# Patient Record
Sex: Male | Born: 2000
Health system: Southern US, Community
[De-identification: ages and names within clinical notes are randomized; demographics above are authoritative.]

## PROBLEM LIST (undated history)

## (undated) DIAGNOSIS — F909 Attention-deficit hyperactivity disorder, unspecified type: Secondary | ICD-10-CM

## (undated) DIAGNOSIS — R41840 Attention and concentration deficit: Secondary | ICD-10-CM

## (undated) DIAGNOSIS — F812 Mathematics disorder: Secondary | ICD-10-CM

## (undated) HISTORY — DX: Attention and concentration deficit: R41.840

## (undated) HISTORY — DX: Mathematics disorder: F81.2

## (undated) HISTORY — DX: Attention-deficit hyperactivity disorder, unspecified type: F90.9

## (undated) HISTORY — PX: OTHER SURGICAL HISTORY: SHX169

---
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
## 2019-07-18 DIAGNOSIS — Z03818 Encounter for observation for suspected exposure to other biological agents ruled out: Secondary | ICD-10-CM | POA: Diagnosis not present

## 2019-07-18 DIAGNOSIS — Z20822 Contact with and (suspected) exposure to covid-19: Secondary | ICD-10-CM | POA: Diagnosis not present

## 2019-09-17 ENCOUNTER — Emergency Department (INDEPENDENT_AMBULATORY_CARE_PROVIDER_SITE_OTHER): Payer: BC Managed Care – PPO

## 2019-09-17 ENCOUNTER — Emergency Department (INDEPENDENT_AMBULATORY_CARE_PROVIDER_SITE_OTHER)
Admission: EM | Admit: 2019-09-17 | Discharge: 2019-09-17 | Disposition: A | Discharge status: Home / Self Care | Payer: BC Managed Care – PPO | Source: Home / Self Care

## 2019-09-17 ENCOUNTER — Encounter: Payer: Self-pay | Admitting: Emergency Medicine

## 2019-09-17 ENCOUNTER — Other Ambulatory Visit: Payer: Self-pay

## 2019-09-17 DIAGNOSIS — S63622A Sprain of interphalangeal joint of left thumb, initial encounter: Secondary | ICD-10-CM

## 2019-09-17 DIAGNOSIS — W2102XA Struck by soccer ball, initial encounter: Secondary | ICD-10-CM | POA: Diagnosis not present

## 2019-09-17 DIAGNOSIS — S6992XA Unspecified injury of left wrist, hand and finger(s), initial encounter: Secondary | ICD-10-CM | POA: Diagnosis not present

## 2019-09-17 DIAGNOSIS — M7989 Other specified soft tissue disorders: Secondary | ICD-10-CM | POA: Diagnosis not present

## 2019-09-17 DIAGNOSIS — R600 Localized edema: Secondary | ICD-10-CM | POA: Diagnosis not present

## 2019-09-17 MED ORDER — ACETAMINOPHEN 325 MG PO TABS
650.0000 mg | ORAL_TABLET | Freq: Once | ORAL | Status: AC
  Administered 2019-09-17: 650 mg via ORAL

## 2019-09-17 NOTE — ED Provider Notes (Signed)
[facility]    CSN: [id number] Arrival date & time: 09/17/19  1403      History   Chief Complaint Chief Complaint  Patient presents with  . Finger Injury    HPI Brent Leach is a 19 y.o. male.   HPI Brent Leach is a 19 y.o. male presenting to UC with c/o Left thumb pain, bruising, swelling and limited ROM after a friend threw a soccer ball very fast toward the pt while standing close. Pt felt his thumb hyperextend, then jammed into his chest.  Pain is aching and sore, 9/10, limited ROM. He is Right hand dominant. No prior fracture or thumb.    Past Medical History:  Diagnosis Date  . Hyperactivity   . Inattention   . Learning difficulty involving mathematics     Patient Active Problem List   Diagnosis Date Noted  . Hyperbilirubinemia 10/07/2018  . Anxiety disorder 10/06/2018  . Hyperactivity   . Acute Brent Leach virus (EBV) infection 01/13/2018  . Weight loss due to medication 10/21/2017  . Elevated blood pressure reading 06/02/2017  . Learning difficulty involving mathematics 04/28/2017  . Inattention 04/28/2017    Past Surgical History:  Procedure Laterality Date  . jaw tumor (benign)         Home Medications    Prior to Admission medications   Medication Sig Start Date End Date Taking? Authorizing Provider  lisdexamfetamine (VYVANSE) 20 MG capsule Take 1 capsule (20 mg total) by mouth every morning. 10/06/18   Carlis Stable, PA-C    Family History Family History  Problem Relation Age of Onset  . Skin cancer Other        grandfather  . Depression Mother   . Diabetes Other        grandparents  . Anxiety disorder Brother     Social History Social History   Tobacco Use  . Smoking status: Never Smoker  . Smokeless tobacco: Never Used  Vaping Use  . Vaping Use: Never used  Substance Use Topics  . Alcohol use: Yes  . Drug use: No     Allergies   Concerta [methylphenidate hcl]   Review of Systems Review of  Systems  Musculoskeletal: Positive for arthralgias and joint swelling.  Skin: Positive for color change. Negative for wound.     Physical Exam Triage Vital Signs ED Triage Vitals  Enc Vitals Group     BP 09/17/19 1418 (!) 137/95     Pulse Rate 09/17/19 1417 92     Resp --      Temp 09/17/19 1417 98.4 F (36.9 C)     Temp Source 09/17/19 1417 Oral     SpO2 09/17/19 1418 97 %     Weight 09/17/19 1420 168 lb (76.2 kg)     Height 09/17/19 1420 5\' 10"  (1.778 m)     Head Circumference --      Peak Flow --      Pain Score 09/17/19 1419 9     Pain Loc --      Pain Edu? --      Excl. in GC? --    No data found.  Updated Vital Signs BP (!) 137/95 (BP Location: Right Arm)   Pulse 92   Temp 98.4 F (36.9 C) (Oral)   Ht 5\' 10"  (1.778 m)   Wt 168 lb (76.2 kg)   SpO2 97%   BMI 24.11 kg/m   Visual Acuity Right Eye Distance:   Left Eye  Distance:   Bilateral Distance:    Right Eye Near:   Left Eye Near:    Bilateral Near:     Physical Exam Vitals and nursing note reviewed.  Constitutional:      Appearance: Normal appearance. He is well-developed.  HENT:     Head: Normocephalic and atraumatic.  Cardiovascular:     Rate and Rhythm: Normal rate.  Pulmonary:     Effort: Pulmonary effort is normal.  Musculoskeletal:        General: Swelling and tenderness present.     Cervical back: Normal range of motion.     Comments: Left thumb: moderate edema, diffuse tenderness. Limited ROM, unable to flex at PIP joint, pain/swelling. Full ROM all other fingers and wrist. No snuffbox tenderness.   Skin:    General: Skin is warm and dry.     Capillary Refill: Capillary refill takes less than 2 seconds.     Findings: Bruising ( Left thumb) present.  Neurological:     Mental Status: He is alert and oriented to person, place, and time.     Sensory: No sensory deficit.  Psychiatric:        Behavior: Behavior normal.      UC Treatments / Results  Labs (all labs ordered are  listed, but only abnormal results are displayed) Labs Reviewed - No data to display  EKG   Radiology Narrative & Impression  CLINICAL DATA:  Soccer injury of thumb  EXAM: LEFT HAND - COMPLETE 3+ VIEW  COMPARISON:  None.  FINDINGS: No acute fracture or dislocation. Joint spaces and alignment are maintained. No area of erosion or osseous destruction. No unexpected radiopaque foreign body. Soft tissue edema of the thumb.  IMPRESSION: No acute fracture or dislocation.   Electronically Signed   By: Meda Klinefelter MD   On: 09/17/2019 14:57    Procedures Procedures (including critical care time)  Medications Ordered in UC Medications  acetaminophen (TYLENOL) tablet 650 mg (650 mg Oral Given 09/17/19 1514)    Initial Impression / Assessment and Plan / UC Course  I have reviewed the triage vital signs and the nursing notes.  Pertinent labs & imaging results that were available during my care of the patient were reviewed by me and considered in my medical decision making (see chart for details).    Discussed imaging with pt Place pt in thumb spica splint Encouraged f/u with Orthopedist or Sports Medicine AVS given  Final Clinical Impressions(s) / UC Diagnoses   Final diagnoses:  Sprain of interphalangeal joint of left thumb, initial encounter     Discharge Instructions      You may take 500mg  acetaminophen every 4-6 hours or in combination with ibuprofen 400-600mg  every 6-8 hours as needed for pain and inflammation.  You should wear the brace at all times except for bathing until you are evaluated by a Sports Medicine provider or orthopedist next week.   Call to schedule a follow up appointment with Sports Medicine or an Orthopedist next week for further evaluation and treatment of thumb injury.     ED Prescriptions    None     PDMP not reviewed this encounter.   , Lurene Shadow 09/20/19 [phone]

## 2019-09-17 NOTE — ED Triage Notes (Addendum)
LT thumb injured last night jammed while playing soccer. Vaccinated

## 2019-09-17 NOTE — Discharge Instructions (Signed)
  You may take 500mg  acetaminophen every 4-6 hours or in combination with ibuprofen 400-600mg  every 6-8 hours as needed for pain and inflammation.  You should wear the brace at all times except for bathing until you are evaluated by a Sports Medicine provider or orthopedist next week.   Call to schedule a follow up appointment with Sports Medicine or an Orthopedist next week for further evaluation and treatment of thumb injury.

## 2019-09-22 DIAGNOSIS — M79645 Pain in left finger(s): Secondary | ICD-10-CM | POA: Diagnosis not present

## 2019-09-23 ENCOUNTER — Ambulatory Visit (INDEPENDENT_AMBULATORY_CARE_PROVIDER_SITE_OTHER): Payer: BC Managed Care – PPO | Admitting: Family Medicine

## 2019-09-23 ENCOUNTER — Encounter: Payer: Self-pay | Admitting: Family Medicine

## 2019-09-23 DIAGNOSIS — F988 Other specified behavioral and emotional disorders with onset usually occurring in childhood and adolescence: Secondary | ICD-10-CM | POA: Insufficient documentation

## 2019-09-23 DIAGNOSIS — F900 Attention-deficit hyperactivity disorder, predominantly inattentive type: Secondary | ICD-10-CM | POA: Diagnosis not present

## 2019-09-23 DIAGNOSIS — R41840 Attention and concentration deficit: Secondary | ICD-10-CM | POA: Diagnosis not present

## 2019-09-23 MED ORDER — LISDEXAMFETAMINE DIMESYLATE 20 MG PO CAPS: 20.0000 mg | ORAL_CAPSULE | ORAL | 0 refills | Status: DC

## 2019-09-23 NOTE — Progress Notes (Signed)
Brent Leach - 19 y.o. male MRN [id number]  Date of birth: 04-12-2000  Subjective Chief Complaint  Patient presents with  . Establish Care    HPI Brent Leach is a 19 y.o. male with history of ADHD here today for follow up.  ADHD previously treated with Vyvanse.  He had been off for several months due to previous PCP leaving.  He has noticed increased difficulty concentrating at work and at school since being off medication.  He would like to restart this.  He denies side effects from this previously.    ROS:  A comprehensive ROS was completed and negative except as noted per HPI  Allergies  Allergen Reactions  . Concerta [Methylphenidate Hcl] Other (See Comments)    hypersomnia    Past Medical History:  Diagnosis Date  . Hyperactivity   . Inattention   . Learning difficulty involving mathematics     Past Surgical History:  Procedure Laterality Date  . jaw tumor (benign)      Social History   Socioeconomic History  . Marital status: Single    Spouse name: Not on file  . Number of children: Not on file  . Years of education: Not on file  . Highest education level: Not on file  Occupational History  . Not on file  Tobacco Use  . Smoking status: Never Smoker  . Smokeless tobacco: Never Used  Vaping Use  . Vaping Use: Never used  Substance and Sexual Activity  . Alcohol use: Yes  . Drug use: No  . Sexual activity: Never  Other Topics Concern  . Not on file  Social History Narrative  . Not on file   Social Determinants of Health   Financial Resource Strain:   . Difficulty of Paying Living Expenses: Not on file  Food Insecurity:   . Worried About Programme researcher, broadcasting/film/video in the Last Year: Not on file  . Ran Out of Food in the Last Year: Not on file  Transportation Needs:   . Lack of Transportation (Medical): Not on file  . Lack of Transportation (Non-Medical): Not on file  Physical Activity:   . Days of Exercise per Week: Not on file  . Minutes of Exercise per  Session: Not on file  Stress:   . Feeling of Stress : Not on file  Social Connections:   . Frequency of Communication with Friends and Family: Not on file  . Frequency of Social Gatherings with Friends and Family: Not on file  . Attends Religious Services: Not on file  . Active Member of Clubs or Organizations: Not on file  . Attends Banker Meetings: Not on file  . Marital Status: Not on file    Family History  Problem Relation Age of Onset  . Skin cancer Other        grandfather  . Depression Mother   . Diabetes Other        grandparents  . Anxiety disorder Brother     Health Maintenance  Topic Date Due  . Hepatitis C Screening  Never done  . COVID-19 Vaccine (1) Never done  . HIV Screening  Never done  . INFLUENZA VACCINE  09/05/2019  . TETANUS/TDAP  09/26/2021     ----------------------------------------------------------------------------------------------------------------------------------------------------------------------------------------------------------------- Physical Exam BP 125/78 (BP Location: Left Arm, Patient Position: Sitting, Cuff Size: Normal)   Pulse 72   Temp 98.2 F (36.8 C) (Oral)   Ht 5' 10.87" (1.8 m)   Wt 177 lb 6.4 oz (  80.5 kg)   SpO2 99%   BMI 24.84 kg/m   Physical Exam Constitutional:      Appearance: Normal appearance.  Eyes:     General: No scleral icterus. Cardiovascular:     Rate and Rhythm: Normal rate and regular rhythm.  Pulmonary:     Effort: Pulmonary effort is normal.     Breath sounds: Normal breath sounds.  Musculoskeletal:     Cervical back: Neck supple.  Neurological:     Mental Status: He is alert.  Psychiatric:        Mood and Affect: Mood normal.        Behavior: Behavior normal.      ------------------------------------------------------------------------------------------------------------------------------------------------------------------------------------------------------------------- Assessment and Plan  ADD (attention deficit disorder) Restart vyvanse at previous dosing.  Discussed need to follow up and consistent use of medication.  Return in 3 months.    Meds ordered this encounter  Medications  . lisdexamfetamine (VYVANSE) 20 MG capsule    Sig: Take 1 capsule (20 mg total) by mouth every morning.    Dispense:  30 capsule    Refill:  0    Call patient when ready    Return in about 3 months (around 12/24/2019) for ADD.    This visit occurred during the SARS-CoV-2 public health emergency.  Safety protocols were in place, including screening questions prior to the visit, additional usage of staff PPE, and extensive cleaning of exam room while observing appropriate contact time as indicated for disinfecting solutions.

## 2019-09-23 NOTE — Assessment & Plan Note (Signed)
Restart vyvanse at previous dosing.  Discussed need to follow up and consistent use of medication.  Return in 3 months.

## 2019-09-23 NOTE — Progress Notes (Signed)
Patient states had J&J vaccine via [employer].  Will callback with vaccine info.

## 2019-10-29 ENCOUNTER — Emergency Department
Admission: EM | Admit: 2019-10-29 | Discharge: 2019-10-29 | Disposition: A | Discharge status: Home / Self Care | Payer: BC Managed Care – PPO | Source: Home / Self Care

## 2019-10-29 ENCOUNTER — Encounter: Payer: Self-pay | Admitting: Emergency Medicine

## 2019-10-29 ENCOUNTER — Other Ambulatory Visit: Payer: Self-pay

## 2019-10-29 DIAGNOSIS — J069 Acute upper respiratory infection, unspecified: Secondary | ICD-10-CM | POA: Diagnosis not present

## 2019-10-29 DIAGNOSIS — J029 Acute pharyngitis, unspecified: Secondary | ICD-10-CM | POA: Diagnosis not present

## 2019-10-29 DIAGNOSIS — H6123 Impacted cerumen, bilateral: Secondary | ICD-10-CM

## 2019-10-29 NOTE — Discharge Instructions (Signed)
  You may take 500mg  acetaminophen every 4-6 hours or in combination with ibuprofen 400-600mg  every 6-8 hours as needed for pain, inflammation, and fever.  Be sure to well hydrated with clear liquids and get at least 8 hours of sleep at night, preferably more while sick.   Please follow up with family medicine in 1 week if needed.   You should stay home until the COVID test results come back. Results have been taking 2-4 days. You will be notified ONLY for Positive results. All results will be visible on your free [facility] MyChart account.

## 2019-10-29 NOTE — ED Provider Notes (Signed)
[name]    CSN: [id number] Arrival date & time: 10/29/19  1250      History   Chief Complaint Chief Complaint  Patient presents with  . Sore Throat    HPI Brent Leach is a 19 y.o. male.   HPI  Brent Leach is a 19 y.o. male presenting to UC with c/o sore throat, nasal congestion, cough, fever, chills, and body aches for 3 days. Associated Right ear fullness, intermittent. He received J&J COVID vaccine in April 2021.  No known sick contacts but pt does work at a [employer] and is around a lot of people everyday. He does wear a mask.  Denies chest pain or SOB. No n/v/d.    Past Medical History:  Diagnosis Date  . Hyperactivity   . Inattention   . Learning difficulty involving mathematics     Patient Active Problem List   Diagnosis Date Noted  . ADD (attention deficit disorder) 09/23/2019  . Hyperbilirubinemia 10/07/2018  . Anxiety disorder 10/06/2018  . Hyperactivity   . Acute Malachi Carl virus (EBV) infection 01/13/2018  . Weight loss due to medication 10/21/2017  . Elevated blood pressure reading 06/02/2017  . Learning difficulty involving mathematics 04/28/2017  . Inattention 04/28/2017    Past Surgical History:  Procedure Laterality Date  . jaw tumor (benign)         Home Medications    Prior to Admission medications   Medication Sig Start Date End Date Taking? Authorizing Provider  Pseudoeph-Doxylamine-DM-APAP (DAYQUIL/NYQUIL COLD/FLU RELIEF PO) Take by mouth.   Yes [provider]  lisdexamfetamine (VYVANSE) 20 MG capsule Take 1 capsule (20 mg total) by mouth every morning. 09/23/19   Everrett Coombe, DO    Family History Family History  Problem Relation Age of Onset  . Skin cancer Other        grandfather  . Depression Mother   . Diabetes Other        grandparents  . Anxiety disorder Brother     Social History Social History   Tobacco Use  . Smoking status: Never Smoker  . Smokeless tobacco: Never Used   Vaping Use  . Vaping Use: Never used  Substance Use Topics  . Alcohol use: Yes  . Drug use: No     Allergies   Concerta [methylphenidate hcl]   Review of Systems Review of Systems  Constitutional: Positive for chills and fever.  HENT: Positive for congestion and sore throat. Negative for ear pain, trouble swallowing and voice change.   Respiratory: Positive for cough. Negative for shortness of breath.   Cardiovascular: Negative for chest pain and palpitations.  Gastrointestinal: Negative for abdominal pain, diarrhea, nausea and vomiting.  Musculoskeletal: Positive for arthralgias, back pain and myalgias.  Skin: Negative for rash.  Neurological: Positive for headaches. Negative for dizziness and light-headedness.  All other systems reviewed and are negative.    Physical Exam Triage Vital Signs ED Triage Vitals  Enc Vitals Group     BP 10/29/19 1303 (!) 147/84     Pulse Rate 10/29/19 1303 76     Resp --      Temp 10/29/19 1303 98.1 F (36.7 C)     Temp Source 10/29/19 1303 Oral     SpO2 10/29/19 1303 98 %     Weight 10/29/19 1304 175 lb (79.4 kg)     Height 10/29/19 1304 5\' 11"  (1.803 m)     Head Circumference --      Peak  Flow --      Pain Score 10/29/19 1304 8     Pain Loc --      Pain Edu? --      Excl. in GC? --    No data found.  Updated Vital Signs BP (!) 147/84 (BP Location: Right Arm)   Pulse 76   Temp 98.1 F (36.7 C) (Oral)   Ht 5\' 11"  (1.803 m)   Wt 175 lb (79.4 kg)   SpO2 98%   BMI 24.41 kg/m   Visual Acuity Right Eye Distance:   Left Eye Distance:   Bilateral Distance:    Right Eye Near:   Left Eye Near:    Bilateral Near:     Physical Exam Vitals and nursing note reviewed.  Constitutional:      General: He is not in acute distress.    Appearance: He is well-developed. He is not ill-appearing, toxic-appearing or diaphoretic.  HENT:     Head: Normocephalic and atraumatic.     Right Ear: Tympanic membrane and ear canal normal. There  is impacted cerumen. Tympanic membrane is not erythematous or bulging.     Left Ear: Tympanic membrane and ear canal normal. There is impacted cerumen. Tympanic membrane is not erythematous or bulging.     Nose: Nose normal.     Right Sinus: No maxillary sinus tenderness or frontal sinus tenderness.     Left Sinus: No maxillary sinus tenderness or frontal sinus tenderness.     Mouth/Throat:     Lips: Pink.     Mouth: Mucous membranes are moist.     Pharynx: Oropharynx is clear. Uvula midline. No pharyngeal swelling, oropharyngeal exudate, posterior oropharyngeal erythema or uvula swelling.     Tonsils: No tonsillar exudate or tonsillar abscesses.  Cardiovascular:     Rate and Rhythm: Normal rate and regular rhythm.  Pulmonary:     Effort: Pulmonary effort is normal. No respiratory distress.     Breath sounds: Normal breath sounds. No stridor. No wheezing, rhonchi or rales.  Musculoskeletal:        General: Normal range of motion.     Cervical back: Normal range of motion and neck supple.  Lymphadenopathy:     Cervical: No cervical adenopathy.  Skin:    General: Skin is warm and dry.  Neurological:     Mental Status: He is alert and oriented to person, place, and time.  Psychiatric:        Behavior: Behavior normal.      UC Treatments / Results  Labs (all labs ordered are listed, but only abnormal results are displayed) Labs Reviewed  NOVEL CORONAVIRUS, NAA    EKG   Radiology No results found.  Procedures Procedures (including critical care time)  Medications Ordered in UC Medications - No data to display  Initial Impression / Assessment and Plan / UC Course  I have reviewed the triage vital signs and the nursing notes.  Pertinent labs & imaging results that were available during my care of the patient were reviewed by me and considered in my medical decision making (see chart for details).    Hx and exam not c/w strep pharyngitis Cerumen impaction noted on  exam, successfully flushed, no evidence of AOM or otitis externa. Exam c/w viral URI COVID test pending F/u with PCP as needed AVS given  Final Clinical Impressions(s) / UC Diagnoses   Final diagnoses:  Viral URI with cough  Acute pharyngitis, unspecified etiology  Bilateral impacted cerumen  Discharge Instructions      You may take 500mg  acetaminophen every 4-6 hours or in combination with ibuprofen 400-600mg  every 6-8 hours as needed for pain, inflammation, and fever.  Be sure to well hydrated with clear liquids and get at least 8 hours of sleep at night, preferably more while sick.   Please follow up with family medicine in 1 week if needed.   You should stay home until the COVID test results come back. Results have been taking 2-4 days. You will be notified ONLY for Positive results. All results will be visible on your free [facility] MyChart account.     ED Prescriptions    None     PDMP not reviewed this encounter.   , Lurene Shadow 10/29/19 [phone]

## 2019-10-29 NOTE — ED Triage Notes (Signed)
Headache, sore throat, chills, bodyaches, fever, nausea, cough x 3 days Vaccinated

## 2019-10-31 LAB — NOVEL CORONAVIRUS, NAA: SARS-CoV-2, NAA: NOT DETECTED

## 2019-10-31 LAB — SARS-COV-2, NAA 2 DAY TAT

## 2019-11-11 ENCOUNTER — Telehealth (INDEPENDENT_AMBULATORY_CARE_PROVIDER_SITE_OTHER): Payer: BC Managed Care – PPO | Admitting: Family Medicine

## 2019-11-11 ENCOUNTER — Encounter: Payer: Self-pay | Admitting: Family Medicine

## 2019-11-11 DIAGNOSIS — J069 Acute upper respiratory infection, unspecified: Secondary | ICD-10-CM | POA: Diagnosis not present

## 2019-11-11 NOTE — Progress Notes (Signed)
Brent Leach - 19 y.o. male MRN [id number]  Date of birth: 2000/03/22   This visit type was conducted due to national recommendations for restrictions regarding the COVID-19 Pandemic (e.g. social distancing).  This format is felt to be most appropriate for this patient at this time.  All issues noted in this document were discussed and addressed.  No physical exam was performed (except for noted visual exam findings with Video Visits).  I discussed the limitations of evaluation and management by telemedicine and the availability of in person appointments. The patient expressed understanding and agreed to proceed.  I connected with@ on 11/11/19 at 11:30 AM EDT by a video enabled telemedicine application and verified that I am speaking with the correct person using two identifiers.  Present at visit: Brent Coombe, DO [facility] Feazell   Patient Location: Home [address] [place] [address]   Provider location:   [facility]  Chief Complaint  Patient presents with  . Cough  . Nasal Congestion    HPI  Brent Leach is a 19 y.o. male who presents via audio/video conferencing for a telehealth visit today.  He has complaint of cough and congestion.  Initial onset of symptoms was about 1.5-2 weeks ago.  Started with headache, sore throat, congestion, cough, fever, chills and body aches.  He was seen at Urgent care on 9/24 and had COVID test which returned negative.  Symptoms had nearly resolved and then a couple of days ago started having some mild cough and congestion.  He has not had any further fevers, chills, body aches or headaches.  His appetite and fluid intake have been good.    ROS:  A comprehensive ROS was completed and negative except as noted per HPI    ROS:  A comprehensive ROS was completed and negative except as noted per HPI  Past Medical History:  Diagnosis Date  . Hyperactivity   . Inattention   . Learning difficulty involving mathematics     Past Surgical History:   Procedure Laterality Date  . jaw tumor (benign)      Family History  Problem Relation Age of Onset  . Skin cancer Other        grandfather  . Depression Mother   . Diabetes Other        grandparents  . Anxiety disorder Brother     Social History   Socioeconomic History  . Marital status: Single    Spouse name: Not on file  . Number of children: Not on file  . Years of education: Not on file  . Highest education level: Not on file  Occupational History  . Not on file  Tobacco Use  . Smoking status: Never Smoker  . Smokeless tobacco: Never Used  Vaping Use  . Vaping Use: Never used  Substance and Sexual Activity  . Alcohol use: Yes  . Drug use: No  . Sexual activity: Never  Other Topics Concern  . Not on file  Social History Narrative  . Not on file   Social Determinants of Health   Financial Resource Strain:   . Difficulty of Paying Living Expenses: Not on file  Food Insecurity:   . Worried About Programme researcher, broadcasting/film/video in the Last Year: Not on file  . Ran Out of Food in the Last Year: Not on file  Transportation Needs:   . Lack of Transportation (Medical): Not on file  . Lack of Transportation (Non-Medical): Not on file  Physical Activity:   .  Days of Exercise per Week: Not on file  . Minutes of Exercise per Session: Not on file  Stress:   . Feeling of Stress : Not on file  Social Connections:   . Frequency of Communication with Friends and Family: Not on file  . Frequency of Social Gatherings with Friends and Family: Not on file  . Attends Religious Services: Not on file  . Active Member of Clubs or Organizations: Not on file  . Attends Banker Meetings: Not on file  . Marital Status: Not on file  Intimate Partner Violence:   . Fear of Current or Ex-Partner: Not on file  . Emotionally Abused: Not on file  . Physically Abused: Not on file  . Sexually Abused: Not on file     Current Outpatient Medications:  .  lisdexamfetamine (VYVANSE)  20 MG capsule, Take 1 capsule (20 mg total) by mouth every morning., Disp: 30 capsule, Rfl: 0 .  Pseudoeph-Doxylamine-DM-APAP (DAYQUIL/NYQUIL COLD/FLU RELIEF PO), Take by mouth., Disp: , Rfl:   EXAM:  VITALS per patient if applicable: Temp 98 F (36.7 C)   Wt 175 lb (79.4 kg)   BMI 24.41 kg/m   GENERAL: alert, oriented, appears well and in no acute distress  HEENT: atraumatic, conjunttiva clear, no obvious abnormalities on inspection of external nose and ears  NECK: normal movements of the head and neck  LUNGS: on inspection no signs of respiratory distress, breathing rate appears normal, no obvious gross SOB, gasping or wheezing  CV: no obvious cyanosis  MS: moves all visible extremities without noticeable abnormality  PSYCH/NEURO: pleasant and cooperative, no obvious depression or anxiety, speech and thought processing grossly intact  ASSESSMENT AND PLAN:  Discussed the following assessment and plan:  Viral URI Symptoms seem to be improving.  Still with some lingering cough and congestion.  >10 days since symptom onset and had negative covid test.  I recommended continued supportive care at home.  He may continue OTC symptomatic treatment as needed per packaging directions.  Instructed to call for new or worsening symptoms.      I discussed the assessment and treatment plan with the patient. The patient was provided an opportunity to ask questions and all were answered. The patient agreed with the plan and demonstrated an understanding of the instructions.   The patient was advised to call back or seek an in-person evaluation if the symptoms worsen or if the condition fails to improve as anticipated.    Brent Coombe, DO

## 2019-11-11 NOTE — Assessment & Plan Note (Signed)
Symptoms seem to be improving.  Still with some lingering cough and congestion.  >10 days since symptom onset and had negative covid test.  I recommended continued supportive care at home.  He may continue OTC symptomatic treatment as needed per packaging directions.  Instructed to call for new or worsening symptoms.

## 2019-11-11 NOTE — Progress Notes (Signed)
Symptom x 1 week. Stopped on Sat.  Returned on Sunday.  Headache Sore throat  Cough Fever Chills Body aches  Current symptoms: cough, congestion  COVID: [facility] 05/06/2019

## 2019-12-24 ENCOUNTER — Ambulatory Visit: Payer: BC Managed Care – PPO | Admitting: Family Medicine

## 2020-03-22 ENCOUNTER — Ambulatory Visit (INDEPENDENT_AMBULATORY_CARE_PROVIDER_SITE_OTHER): Payer: BC Managed Care – PPO | Admitting: Family Medicine

## 2020-03-22 ENCOUNTER — Other Ambulatory Visit: Payer: Self-pay

## 2020-03-22 ENCOUNTER — Encounter: Payer: Self-pay | Admitting: Family Medicine

## 2020-03-22 DIAGNOSIS — F900 Attention-deficit hyperactivity disorder, predominantly inattentive type: Secondary | ICD-10-CM | POA: Diagnosis not present

## 2020-03-22 DIAGNOSIS — R41840 Attention and concentration deficit: Secondary | ICD-10-CM | POA: Diagnosis not present

## 2020-03-22 MED ORDER — LISDEXAMFETAMINE DIMESYLATE 20 MG PO CAPS: 20.0000 mg | ORAL_CAPSULE | ORAL | 0 refills | Status: DC

## 2020-03-22 NOTE — Assessment & Plan Note (Signed)
He continues to do well with current dose of vyvanse.   Rx renewed.  PDMP reviewed.  Return in 3 months.

## 2020-03-22 NOTE — Patient Instructions (Signed)
I have renewed vyvanse Follow up with me in 3 months.

## 2020-03-22 NOTE — Progress Notes (Signed)
Brent Leach - 20 y.o. male MRN [id number]  Date of birth: 11-15-2000  Subjective Chief Complaint  Patient presents with  . Medication Refill    HPI Brent Leach is a 20 y.o. male here today for follow up of ADHD.  He has been treated with vyvanse 20mg  daily.  Currently in business school at and works at [employer]. Reports that medication continues to work well for him.  He denies side effects including palpitations, headache, anxiety, or insomnia.   ROS:  A comprehensive ROS was completed and negative except as noted per HPI  Allergies  Allergen Reactions  . Concerta [Methylphenidate Hcl] Other (See Comments)    hypersomnia    Past Medical History:  Diagnosis Date  . Hyperactivity   . Inattention   . Learning difficulty involving mathematics     Past Surgical History:  Procedure Laterality Date  . jaw tumor (benign)      Social History   Socioeconomic History  . Marital status: Single    Spouse name: Not on file  . Number of children: Not on file  . Years of education: Not on file  . Highest education level: Not on file  Occupational History  . Not on file  Tobacco Use  . Smoking status: Never Smoker  . Smokeless tobacco: Never Used  Vaping Use  . Vaping Use: Never used  Substance and Sexual Activity  . Alcohol use: Yes  . Drug use: No  . Sexual activity: Never  Other Topics Concern  . Not on file  Social History Narrative  . Not on file   Social Determinants of Health   Financial Resource Strain: Not on file  Food Insecurity: Not on file  Transportation Needs: Not on file  Physical Activity: Not on file  Stress: Not on file  Social Connections: Not on file    Family History  Problem Relation Age of Onset  . Skin cancer Other        grandfather  . Depression Mother   . Diabetes Other        grandparents  . Anxiety disorder Brother     Health Maintenance  Topic Date Due  . Hepatitis C Screening  Never done  . HIV Screening  Never  done  . COVID-19 Vaccine (2 - Booster for [employer] series) 07/01/2019  . INFLUENZA VACCINE  Never done  . TETANUS/TDAP  09/26/2021     ----------------------------------------------------------------------------------------------------------------------------------------------------------------------------------------------------------------- Physical Exam BP 135/72 (BP Location: Right Arm, Patient Position: Sitting, Cuff Size: Large)   Pulse 96   Temp 98.3 F (36.8 C)   Wt 197 lb (89.4 kg)   BMI 27.48 kg/m   Physical Exam HENT:     Head: Normocephalic and atraumatic.  Eyes:     General: No scleral icterus. Cardiovascular:     Rate and Rhythm: Normal rate and regular rhythm.  Pulmonary:     Effort: Pulmonary effort is normal.     Breath sounds: Normal breath sounds.  Musculoskeletal:     Cervical back: Neck supple.  Neurological:     General: No focal deficit present.  Psychiatric:        Mood and Affect: Mood normal.        Behavior: Behavior normal.     ------------------------------------------------------------------------------------------------------------------------------------------------------------------------------------------------------------------- Assessment and Plan  ADD (attention deficit disorder) He continues to do well with current dose of vyvanse.   Rx renewed.  PDMP reviewed.  Return in 3 months.    Meds ordered this encounter  Medications  . lisdexamfetamine (VYVANSE) 20 MG capsule    Sig: Take 1 capsule (20 mg total) by mouth every morning.    Dispense:  30 capsule    Refill:  0    Call patient when ready    Return in about 3 months (around 06/19/2020) for ADHD.    This visit occurred during the SARS-CoV-2 public health emergency.  Safety protocols were in place, including screening questions prior to the visit, additional usage of staff PPE, and extensive cleaning of exam room while observing appropriate contact time as indicated for  disinfecting solutions.

## 2020-06-19 ENCOUNTER — Other Ambulatory Visit: Payer: Self-pay

## 2020-06-19 ENCOUNTER — Ambulatory Visit (INDEPENDENT_AMBULATORY_CARE_PROVIDER_SITE_OTHER): Payer: BC Managed Care – PPO | Admitting: Family Medicine

## 2020-06-19 ENCOUNTER — Encounter: Payer: Self-pay | Admitting: Family Medicine

## 2020-06-19 ENCOUNTER — Ambulatory Visit: Payer: BC Managed Care – PPO | Admitting: Family Medicine

## 2020-06-19 DIAGNOSIS — F900 Attention-deficit hyperactivity disorder, predominantly inattentive type: Secondary | ICD-10-CM | POA: Diagnosis not present

## 2020-06-19 DIAGNOSIS — R41840 Attention and concentration deficit: Secondary | ICD-10-CM | POA: Diagnosis not present

## 2020-06-19 MED ORDER — LISDEXAMFETAMINE DIMESYLATE 20 MG PO CAPS: 20.0000 mg | ORAL_CAPSULE | ORAL | 0 refills | Status: DC

## 2020-06-19 NOTE — Progress Notes (Signed)
Brent Leach - 20 y.o. male MRN [id number]  Date of birth: [birth year]  Subjective Chief Complaint  Patient presents with  . ADHD    HPI Brent Leach is a 20 y.o. male here today for follow up of adhd.  Currently managed with vyvanse 20mg  daily.  He has done quite well with current medication and denies side effects including increased anxiety, insomnia, palpitations or significant appetite suppression. He is currently out for summer break form college but is working regularly.    ROS:  A comprehensive ROS was completed and negative except as noted per HPI   Allergies  Allergen Reactions  . Concerta [Methylphenidate Hcl] Other (See Comments)    hypersomnia    Past Medical History:  Diagnosis Date  . Hyperactivity   . Inattention   . Learning difficulty involving mathematics     Past Surgical History:  Procedure Laterality Date  . jaw tumor (benign)      Social History   Socioeconomic History  . Marital status: Single    Spouse name: Not on file  . Number of children: Not on file  . Years of education: Not on file  . Highest education level: Not on file  Occupational History  . Not on file  Tobacco Use  . Smoking status: Never Smoker  . Smokeless tobacco: Never Used  Vaping Use  . Vaping Use: Never used  Substance and Sexual Activity  . Alcohol use: Yes  . Drug use: No  . Sexual activity: Never  Other Topics Concern  . Not on file  Social History Narrative  . Not on file   Social Determinants of Health   Financial Resource Strain: Not on file  Food Insecurity: Not on file  Transportation Needs: Not on file  Physical Activity: Not on file  Stress: Not on file  Social Connections: Not on file    Family History  Problem Relation Age of Onset  . Skin cancer Other        grandfather  . Depression Mother   . Diabetes Other        grandparents  . Anxiety disorder Brother     Health Maintenance  Topic Date Due  . HPV VACCINES (1 - Male 2-dose series)  Never done  . HIV Screening  Never done  . Hepatitis C Screening  Never done  . COVID-19 Vaccine (2 - Booster for series) 07/01/2019  . INFLUENZA VACCINE  09/04/2020  . TETANUS/TDAP  09/26/2021     ----------------------------------------------------------------------------------------------------------------------------------------------------------------------------------------------------------------- Physical Exam BP (!) 158/91 (BP Location: Left Arm, Patient Position: Sitting, Cuff Size: Normal)   Pulse 84   Temp 98.3 F (36.8 C)   Ht 5\' 11"  (1.803 m)   Wt 193 lb 9.6 oz (87.8 kg)   SpO2 100%   BMI 27.00 kg/m   Physical Exam Constitutional:      Appearance: Normal appearance.  Eyes:     General: No scleral icterus. Cardiovascular:     Rate and Rhythm: Normal rate and regular rhythm.  Pulmonary:     Effort: Pulmonary effort is normal.     Breath sounds: Normal breath sounds.  Musculoskeletal:     Cervical back: Neck supple.  Neurological:     General: No focal deficit present.     Mental Status: He is alert.  Psychiatric:        Mood and Affect: Mood normal.        Behavior: Behavior normal.     ------------------------------------------------------------------------------------------------------------------------------------------------------------------------------------------------------------------- Assessment and Plan  ADD (attention deficit disorder) He is doing well with Vyvanse 20mg .  We'll plan to continue and have him follow up in 3-4 months.  Medication renewed today.    No orders of the defined types were placed in this encounter.   No follow-ups on file.    This visit occurred during the SARS-CoV-2 public health emergency.  Safety protocols were in place, including screening questions prior to the visit, additional usage of staff PPE, and extensive cleaning of exam room while observing appropriate contact time as indicated for  disinfecting solutions.

## 2020-06-19 NOTE — Assessment & Plan Note (Signed)
He is doing well with Vyvanse 20mg .  We'll plan to continue and have him follow up in 3-4 months.  Medication renewed today.

## 2020-07-05 DIAGNOSIS — S61212A Laceration without foreign body of right middle finger without damage to nail, initial encounter: Secondary | ICD-10-CM | POA: Diagnosis not present

## 2020-09-26 ENCOUNTER — Other Ambulatory Visit: Payer: Self-pay

## 2020-09-26 ENCOUNTER — Encounter: Payer: Self-pay | Admitting: Family Medicine

## 2020-09-26 ENCOUNTER — Ambulatory Visit (INDEPENDENT_AMBULATORY_CARE_PROVIDER_SITE_OTHER): Payer: BC Managed Care – PPO | Admitting: Family Medicine

## 2020-09-26 DIAGNOSIS — R41840 Attention and concentration deficit: Secondary | ICD-10-CM

## 2020-09-26 DIAGNOSIS — F900 Attention-deficit hyperactivity disorder, predominantly inattentive type: Secondary | ICD-10-CM | POA: Diagnosis not present

## 2020-09-26 MED ORDER — LISDEXAMFETAMINE DIMESYLATE 20 MG PO CAPS: 20.0000 mg | ORAL_CAPSULE | ORAL | 0 refills | Status: DC

## 2020-09-26 NOTE — Progress Notes (Signed)
Brent Leach - 20 y.o. male MRN [id number]  Date of birth: 2000-09-22  Subjective Chief Complaint  Patient presents with   ADD    HPI Brent Leach is a 20 year old male here today for follow-up visit.  Continues to do well with Vyvanse 20 mg daily.  He did recent restart college and is a sophomore at [employer].  He has not had any notable side effects from medication including increased anxiety or insomnia.  ROS:  A comprehensive ROS was completed and negative except as noted per HPI  Allergies  Allergen Reactions   Concerta [Methylphenidate Hcl] Other (See Comments)    hypersomnia    Past Medical History:  Diagnosis Date   Hyperactivity    Inattention    Learning difficulty involving mathematics     Past Surgical History:  Procedure Laterality Date   jaw tumor (benign)      Social History   Socioeconomic History   Marital status: Single    Spouse name: Not on file   Number of children: Not on file   Years of education: Not on file   Highest education level: Not on file  Occupational History   Not on file  Tobacco Use   Smoking status: Never   Smokeless tobacco: Never  Vaping Use   Vaping Use: Never used  Substance and Sexual Activity   Alcohol use: Yes   Drug use: No   Sexual activity: Never  Other Topics Concern   Not on file  Social History Narrative   Not on file   Social Determinants of Health   Financial Resource Strain: Not on file  Food Insecurity: Not on file  Transportation Needs: Not on file  Physical Activity: Not on file  Stress: Not on file  Social Connections: Not on file    Family History  Problem Relation Age of Onset   Skin cancer Other        grandfather   Depression Mother    Diabetes Other        grandparents   Anxiety disorder Brother     Health Maintenance  Topic Date Due   Pneumococcal Vaccine 36-64 Years old (1 - PCV) Never done   HPV VACCINES (1 - Male 2-dose series) Never done   HIV Screening  Never done   Hepatitis C  Screening  Never done   COVID-19 Vaccine (2 - [employer] risk series) 06/03/2019   INFLUENZA VACCINE  09/04/2020   TETANUS/TDAP  09/26/2021     ----------------------------------------------------------------------------------------------------------------------------------------------------------------------------------------------------------------- Physical Exam BP 123/81 (BP Location: Left Arm, Patient Position: Sitting, Cuff Size: Large)   Pulse 96   Temp 97.8 F (36.6 C)   Ht 5\' 11"  (1.803 m)   Wt 205 lb (93 kg)   SpO2 99%   BMI 28.59 kg/m   Physical Exam Constitutional:      Appearance: Normal appearance.  HENT:     Head: Normocephalic and atraumatic.  Eyes:     General: No scleral icterus. Cardiovascular:     Rate and Rhythm: Normal rate and regular rhythm.  Musculoskeletal:     Cervical back: Neck supple.  Neurological:     General: No focal deficit present.     Mental Status: He is alert.  Psychiatric:        Mood and Affect: Mood normal.        Behavior: Behavior normal.    ------------------------------------------------------------------------------------------------------------------------------------------------------------------------------------------------------------------- Assessment and Plan  ADD (attention deficit disorder) Doing well with current dose of Vyvanse.  He will let me  know if he notices any increasing symptoms during this semester of college.  We will plan to follow-up in 3 to 4 months.  Updated prescription sent to pharmacy.   Meds ordered this encounter  Medications   lisdexamfetamine (VYVANSE) 20 MG capsule    Sig: Take 1 capsule (20 mg total) by mouth every morning.    Dispense:  30 capsule    Refill:  0    Call patient when ready    Return in about 4 months (around 01/26/2021) for ADD.    This visit occurred during the SARS-CoV-2 public health emergency.  Safety protocols were in place, including screening questions prior  to the visit, additional usage of staff PPE, and extensive cleaning of exam room while observing appropriate contact time as indicated for disinfecting solutions.

## 2020-09-26 NOTE — Assessment & Plan Note (Signed)
Doing well with current dose of Vyvanse.  He will let me know if he notices any increasing symptoms during this semester of college.  We will plan to follow-up in 3 to 4 months.  Updated prescription sent to pharmacy.

## 2021-01-26 ENCOUNTER — Ambulatory Visit: Payer: BC Managed Care – PPO | Admitting: Family Medicine

## 2021-10-27 IMAGING — DX DG HAND COMPLETE 3+V*L*
3 series · 3 of 3 positions shown · non-contrast
Comparison: None.

CLINICAL DATA: Soccer injury of thumb

EXAM:
LEFT HAND - COMPLETE 3+ VIEW

[hand pa]
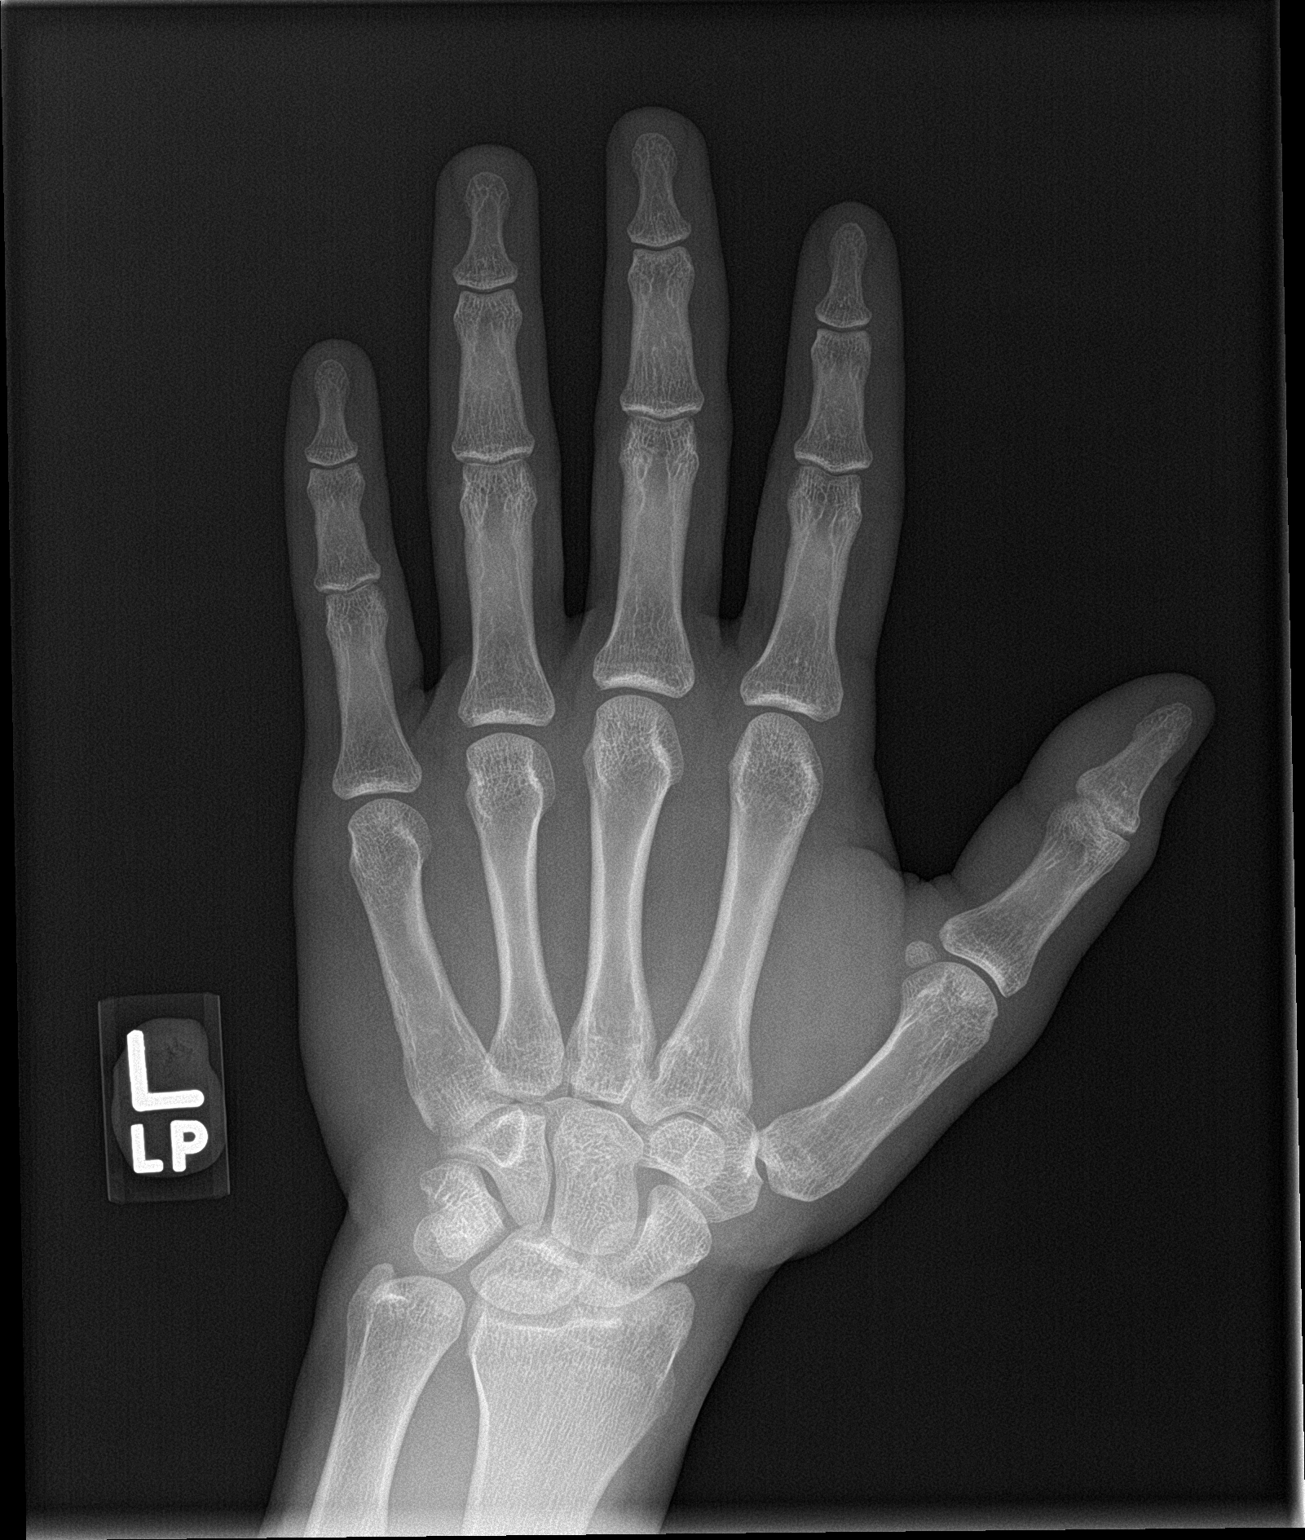

[hand obl]
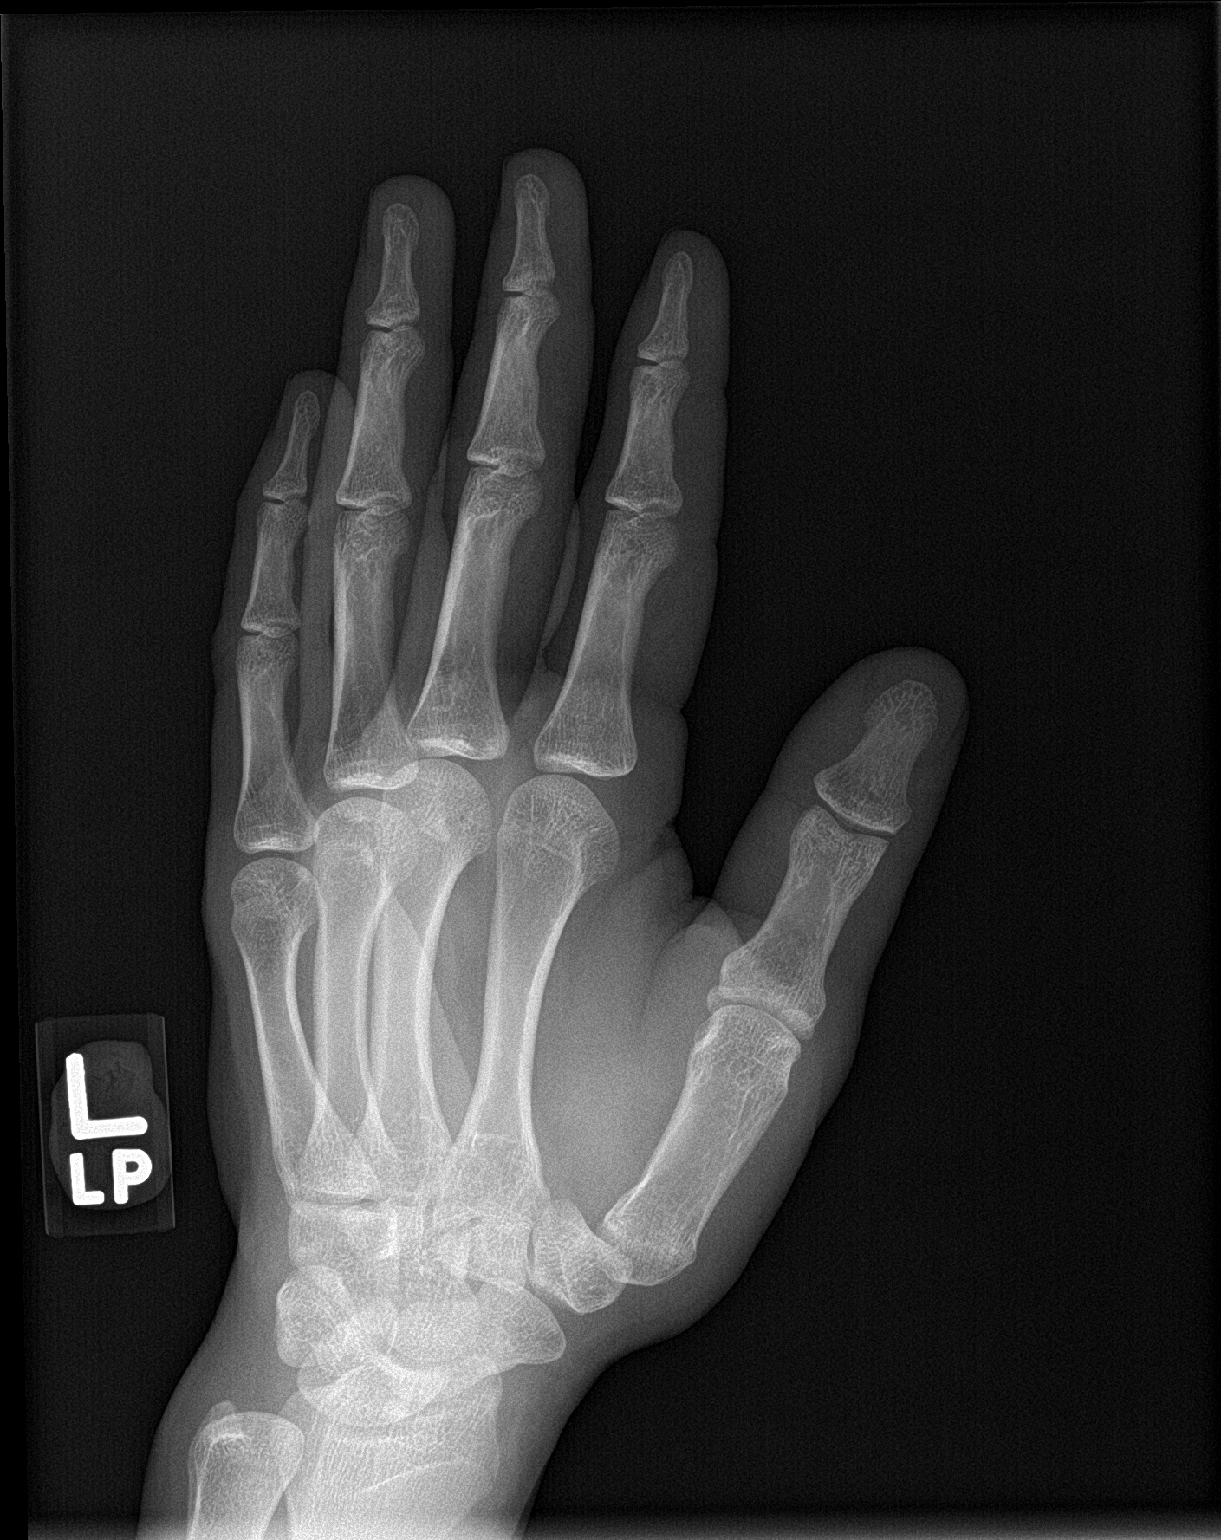

[hand lat]
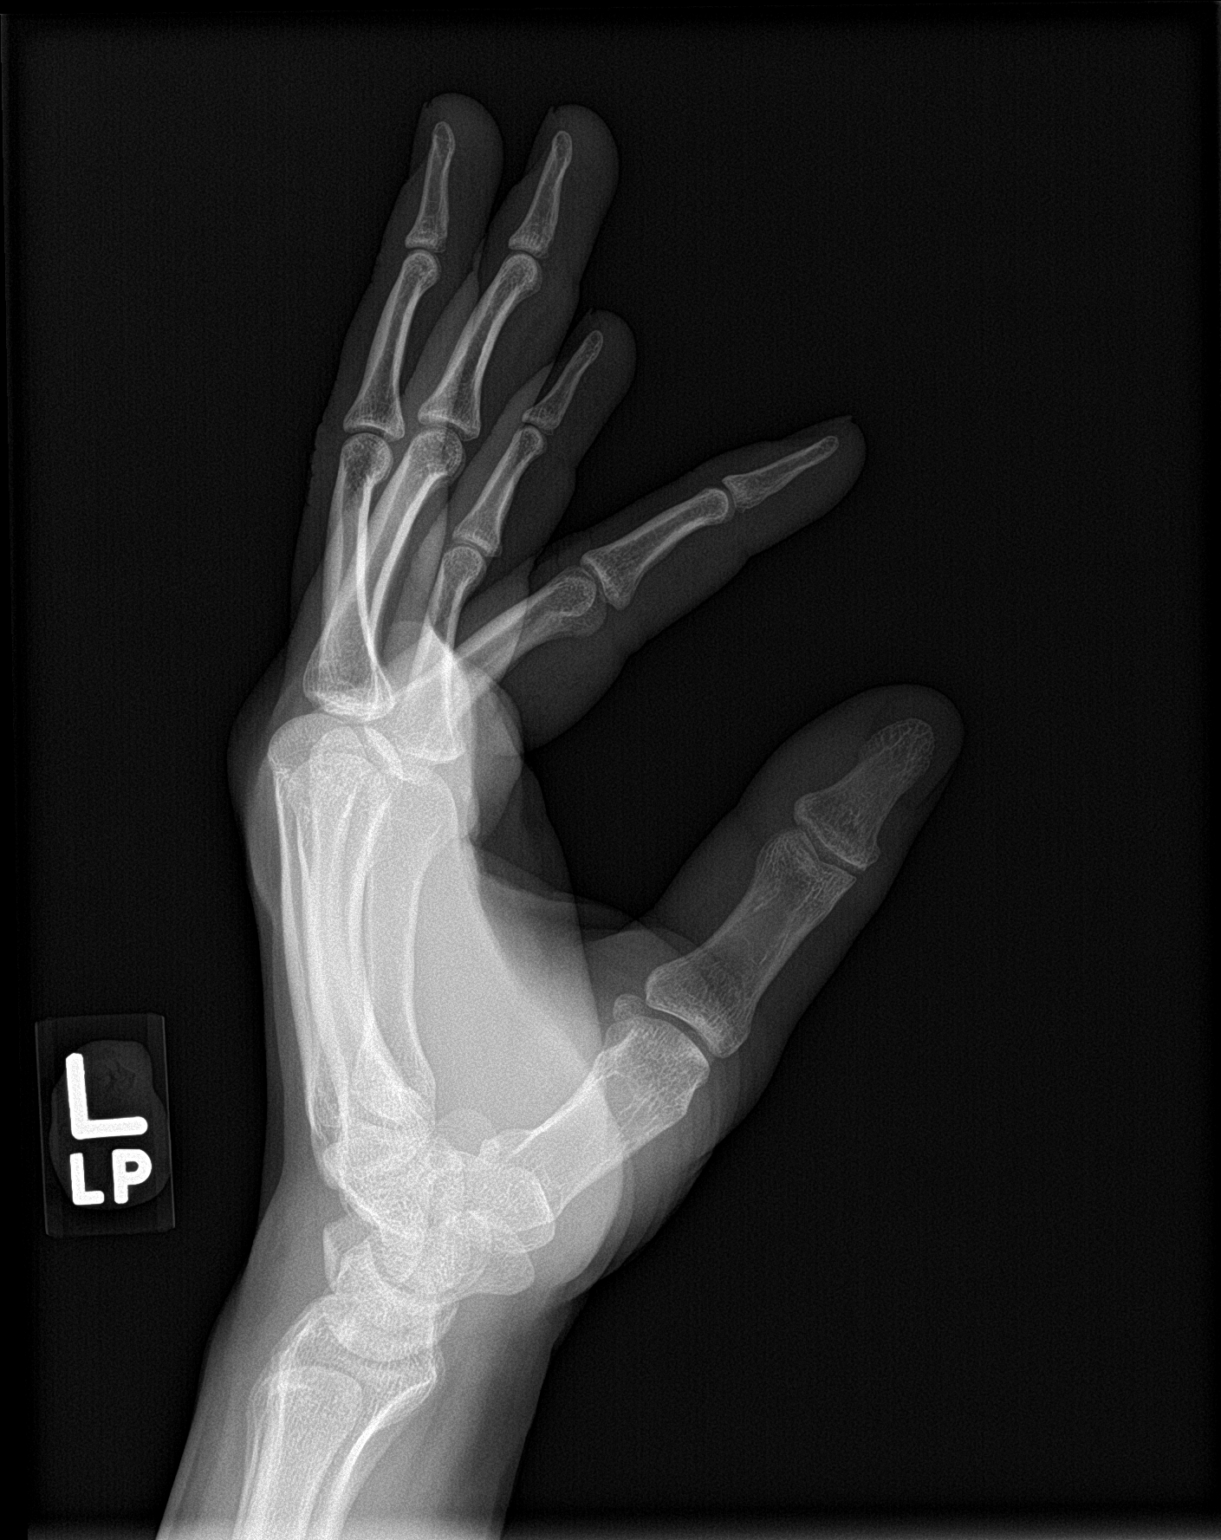

[3 of 3 positions shown; findings below may reference images not displayed]

FINDINGS: No acute fracture or dislocation. Joint spaces and alignment are
maintained. No area of erosion or osseous destruction. No unexpected
radiopaque foreign body. Soft tissue edema of the thumb.
IMPRESSION: No acute fracture or dislocation.

## 2022-03-01 ENCOUNTER — Ambulatory Visit
Admission: RE | Admit: 2022-03-01 | Discharge: 2022-03-01 | Disposition: A | Discharge status: Home / Self Care | Payer: BC Managed Care – PPO | Source: Ambulatory Visit | Attending: Urgent Care | Admitting: Urgent Care

## 2022-03-01 VITALS — BP 138/92 | HR 84 | Temp 98.6°F | Resp 16

## 2022-03-01 DIAGNOSIS — K529 Noninfective gastroenteritis and colitis, unspecified: Secondary | ICD-10-CM | POA: Diagnosis not present

## 2022-03-01 LAB — POCT URINALYSIS DIP (MANUAL ENTRY)
Blood, UA: NEGATIVE
Glucose, UA: NEGATIVE mg/dL
Ketones, POC UA: NEGATIVE mg/dL
Leukocytes, UA: NEGATIVE
Nitrite, UA: NEGATIVE
Protein Ur, POC: 100 mg/dL — AB
Spec Grav, UA: 1.025 (ref 1.010–1.025)
Urobilinogen, UA: 0.2 E.U./dL
pH, UA: 6.5 (ref 5.0–8.0)

## 2022-03-01 LAB — POCT INFLUENZA A/B
Influenza A, POC: NEGATIVE
Influenza B, POC: NEGATIVE

## 2022-03-01 MED ORDER — ONDANSETRON HCL 4 MG/2ML IJ SOLN
4.0000 mg | Freq: Once | INTRAMUSCULAR | Status: AC
  Administered 2022-03-01: 4 mg via INTRAMUSCULAR

## 2022-03-01 MED ORDER — ONDANSETRON 8 MG PO TBDP: 8.0000 mg | ORAL_TABLET | Freq: Three times a day (TID) | ORAL | 0 refills | Status: DC | PRN

## 2022-03-01 MED ORDER — LOPERAMIDE HCL 2 MG PO CAPS: 2.0000 mg | ORAL_CAPSULE | Freq: Two times a day (BID) | ORAL | 0 refills | Status: DC | PRN

## 2022-03-01 NOTE — Discharge Instructions (Signed)

## 2022-03-01 NOTE — ED Triage Notes (Signed)
Reports nausea, vomiting, diarrhea starting yesterday. States he also went to the gym yesterday, and cannot discern whether the generalized body soreness is due to that or being ill.

## 2022-03-01 NOTE — ED Provider Notes (Addendum)
[facility]  Note:  This document was prepared using Dragon voice recognition software and may include unintentional dictation errors.  MRN: [id number] DOB: 14-Jun-2000  Subjective:   Brent Leach is a 22 y.o. male presenting for 1 day history of acute onset persistent nausea, vomiting, diarrhea.  He has had a very difficult time tolerating anything p.o. because of his nausea.  He had 1 episode of vomiting and has had multiple episodes of diarrhea every hour overnight.  Patient has also had some general soreness.  Cannot really discern if this is from working out yesterday at the gym or if it is body pains from his illness.  No fever, bloody stools, recent antibiotic use, hospitalizations or long distance travel.  Has not eaten raw foods, drank unfiltered water.  No history of GI disorders including Crohn's, IBS, ulcerative colitis.   No current facility-administered medications for this encounter.  Current Outpatient Medications:    lisdexamfetamine (VYVANSE) 20 MG capsule, Take 1 capsule (20 mg total) by mouth every morning., Disp: 30 capsule, Rfl: 0   Pseudoeph-Doxylamine-DM-APAP (DAYQUIL/NYQUIL COLD/FLU RELIEF PO), Take by mouth. (Patient not taking: Reported on 09/26/2020), Disp: , Rfl:    Allergies  Allergen Reactions   Concerta [Methylphenidate Hcl] Other (See Comments)    hypersomnia    Past Medical History:  Diagnosis Date   Hyperactivity    Inattention    Learning difficulty involving mathematics      Past Surgical History:  Procedure Laterality Date   jaw tumor (benign)      Family History  Problem Relation Age of Onset   Skin cancer Other        grandfather   Depression Mother    Diabetes Other        grandparents   Anxiety disorder Brother     Social History   Tobacco Use   Smoking status: Never   Smokeless tobacco: Never  Vaping Use   Vaping Use: Never used  Substance Use Topics   Alcohol use: Yes   Drug use: No     ROS   Objective:   Vitals: BP (!) 138/92 (BP Location: Left Arm)   Pulse 84   Temp 98.6 F (37 C) (Oral)   Resp 16   SpO2 98%   Physical Exam Constitutional:      General: He is not in acute distress.    Appearance: Normal appearance. He is well-developed and normal weight. He is not ill-appearing, toxic-appearing or diaphoretic.  HENT:     Head: Normocephalic and atraumatic.     Right Ear: External ear normal.     Left Ear: External ear normal.     Nose: Nose normal.     Mouth/Throat:     Mouth: Mucous membranes are moist.  Eyes:     General: No scleral icterus.       Right eye: No discharge.        Left eye: No discharge.     Extraocular Movements: Extraocular movements intact.     Conjunctiva/sclera: Conjunctivae normal.  Cardiovascular:     Rate and Rhythm: Normal rate and regular rhythm.     Heart sounds: Normal heart sounds. No murmur heard.    No friction rub. No gallop.  Pulmonary:     Effort: Pulmonary effort is normal. No respiratory distress.     Breath sounds: Normal breath sounds. No stridor. No wheezing, rhonchi or rales.  Abdominal:     General: Bowel sounds are increased. There is  no distension.     Palpations: Abdomen is soft. There is no mass.     Tenderness: There is generalized abdominal tenderness. There is no right CVA tenderness, left CVA tenderness, guarding or rebound.  Skin:    General: Skin is warm and dry.  Neurological:     Mental Status: He is alert and oriented to person, place, and time.  Psychiatric:        Mood and Affect: Mood normal.        Behavior: Behavior normal.        Thought Content: Thought content normal.    Results for orders placed or performed during the hospital encounter of 03/01/22 (from the past 24 hour(s))  POCT urinalysis dipstick     Status: Abnormal   Collection Time: 03/01/22  3:40 PM  Result Value Ref Range   Color, UA other (A) yellow   Clarity, UA clear clear   Glucose, UA negative negative  mg/dL   Bilirubin, UA small (A) negative   Ketones, POC UA negative negative mg/dL   Spec Grav, UA 1.025 1.010 - 1.025   Blood, UA negative negative   pH, UA 6.5 5.0 - 8.0   Protein Ur, POC =100 (A) negative mg/dL   Urobilinogen, UA 0.2 0.2 or 1.0 E.U./dL   Nitrite, UA Negative Negative   Leukocytes, UA Negative Negative  POCT Influenza A/B     Status: None   Collection Time: 03/01/22  3:51 PM  Result Value Ref Range   Influenza A, POC Negative Negative   Influenza B, POC Negative Negative   IM Zofran in clinic at 4 mg.  Assessment and Plan :   PDMP not reviewed this encounter.  1. Gastroenteritis     No signs of an acute abdomen. Will manage for suspected viral gastroenteritis with supportive care.  Recommended patient hydrate well, eat light meals and maintain electrolytes.  Will use Zofran and Imodium for nausea, vomiting and diarrhea. Counseled patient on potential for adverse effects with medications prescribed/recommended today, ER and return-to-clinic precautions discussed, patient verbalized understanding.   Jaynee Eagles, [place] 03/01/22 [id number]

## 2022-03-02 ENCOUNTER — Telehealth: Payer: Self-pay

## 2022-03-02 NOTE — Telephone Encounter (Signed)
Called patient regarding recent urgent care visit. Patient reports he is starting to improve. There were no further questions or concerns reported by patient regarding the visit.

## 2022-04-16 ENCOUNTER — Encounter: Payer: Self-pay | Admitting: Family Medicine

## 2022-04-16 ENCOUNTER — Ambulatory Visit: Payer: BC Managed Care – PPO | Admitting: Family Medicine

## 2022-04-16 VITALS — BP 121/88 | HR 74 | Ht 71.0 in | Wt 224.0 lb

## 2022-04-16 DIAGNOSIS — H60502 Unspecified acute noninfective otitis externa, left ear: Secondary | ICD-10-CM

## 2022-04-16 DIAGNOSIS — R41840 Attention and concentration deficit: Secondary | ICD-10-CM

## 2022-04-16 DIAGNOSIS — F900 Attention-deficit hyperactivity disorder, predominantly inattentive type: Secondary | ICD-10-CM

## 2022-04-16 DIAGNOSIS — Z23 Encounter for immunization: Secondary | ICD-10-CM

## 2022-04-16 DIAGNOSIS — H6090 Unspecified otitis externa, unspecified ear: Secondary | ICD-10-CM | POA: Insufficient documentation

## 2022-04-16 MED ORDER — NEOMYCIN-POLYMYXIN-HC 3.5-10000-1 OT SOLN: 4.0000 [drp] | Freq: Four times a day (QID) | OTIC | 0 refills | Status: AC

## 2022-04-16 MED ORDER — LISDEXAMFETAMINE DIMESYLATE 20 MG PO CAPS: 20.0000 mg | ORAL_CAPSULE | Freq: Every day | ORAL | 0 refills | Status: DC

## 2022-04-16 MED ORDER — LISDEXAMFETAMINE DIMESYLATE 20 MG PO CAPS: 20.0000 mg | ORAL_CAPSULE | ORAL | 0 refills | Status: DC

## 2022-04-16 NOTE — Assessment & Plan Note (Signed)
Will treat with topical Cortisporin drops.  He will let me know if symptoms are not improving.

## 2022-04-16 NOTE — Progress Notes (Unsigned)
Brent Leach - 22 y.o. male MRN [id number]  Date of birth: 01-19-2001  Subjective Chief Complaint  Patient presents with   Medication Refill    HPI Brent Leach is a 22 year old male here today for follow-up visit.  Feels like his left ear is clogged.  He did try to clean it with a Q-tip that seems like he pushed the wax down further.  He does have some mild pain in the left ear as well.  Denies drainage from the left ear.  Also following up on ADD.  He is using Vyvanse 20 mg daily as needed previously.  He did do a trial period of not using this however school is becoming more difficult and he would like to have this back on.  He has tolerated this well at current strength.  He denies side effects including decreased appetite, insomnia or increased anxiety.  ROS:  A comprehensive ROS was completed and negative except as noted per HPI  Allergies  Allergen Reactions   Concerta [Methylphenidate Hcl] Other (See Comments)    hypersomnia    Past Medical History:  Diagnosis Date   Hyperactivity    Inattention    Learning difficulty involving mathematics     Past Surgical History:  Procedure Laterality Date   jaw tumor (benign)      Social History   Socioeconomic History   Marital status: Single    Spouse name: Not on file   Number of children: Not on file   Years of education: Not on file   Highest education level: Not on file  Occupational History   Not on file  Tobacco Use   Smoking status: Never   Smokeless tobacco: Never  Vaping Use   Vaping Use: Never used  Substance and Sexual Activity   Alcohol use: Yes   Drug use: No   Sexual activity: Never  Other Topics Concern   Not on file  Social History Narrative   Not on file   Social Determinants of Health   Financial Resource Strain: Not on file  Food Insecurity: Not on file  Transportation Needs: Not on file  Physical Activity: Not on file  Stress: Not on file  Social Connections: Not on file    Family History   Problem Relation Age of Onset   Skin cancer Other        grandfather   Depression Mother    Diabetes Other        grandparents   Anxiety disorder Brother     Health Maintenance  Topic Date Due   HIV Screening  Never done   Hepatitis C Screening  Never done   DTaP/Tdap/Td (2 - Td or Tdap) 09/26/2021   INFLUENZA VACCINE  05/05/2022 (Originally 09/04/2021)   HPV VACCINES (1 - Male 2-dose series) 04/16/2023 (Originally 09/13/2010)   COVID-19 Vaccine (2 - 2023-24 season) 05/02/2023 (Originally 10/05/2021)     ----------------------------------------------------------------------------------------------------------------------------------------------------------------------------------------------------------------- Physical Exam BP 121/88 (BP Location: Right Arm, Patient Position: Sitting, Cuff Size: Large)   Pulse 74   Ht '5\' 11"'$  (1.803 m)   Wt 224 lb (101.6 kg)   SpO2 98%   BMI 31.24 kg/m   Physical Exam Constitutional:      Appearance: Normal appearance.  HENT:     Head: Normocephalic and atraumatic.     Ears:     Comments: Left ear cerumen impaction noted.  Left ear lavage completed.  There is mild swelling with erythema of the EAC. Eyes:     General: No scleral  icterus. Cardiovascular:     Rate and Rhythm: Normal rate and regular rhythm.  Pulmonary:     Effort: Pulmonary effort is normal.     Breath sounds: Normal breath sounds.  Musculoskeletal:     Cervical back: Neck supple.  Neurological:     General: No focal deficit present.     Mental Status: He is alert.  Psychiatric:        Mood and Affect: Mood normal.        Behavior: Behavior normal.     ------------------------------------------------------------------------------------------------------------------------------------------------------------------------------------------------------------------- Assessment and Plan  Otitis externa Will treat with topical Cortisporin drops.  He will let me know if  symptoms are not improving.  ADD (attention deficit disorder) Is a well with Vyvanse in the past.  Restarting this at 20 mg daily. Return in about 4 months (around 08/16/2022) for ADD.   Meds ordered this encounter  Medications   lisdexamfetamine (VYVANSE) 20 MG capsule    Sig: Take 1 capsule (20 mg total) by mouth every morning.    Dispense:  30 capsule    Refill:  0   lisdexamfetamine (VYVANSE) 20 MG capsule    Sig: Take 1 capsule (20 mg total) by mouth daily. Fill in 30 days    Dispense:  30 capsule    Refill:  0   lisdexamfetamine (VYVANSE) 20 MG capsule    Sig: Take 1 capsule (20 mg total) by mouth daily. Fill in 60 days    Dispense:  30 capsule    Refill:  0   neomycin-polymyxin-hydrocortisone (CORTISPORIN) OTIC solution    Sig: Place 4 drops into the left ear 4 (four) times daily for 10 days.    Dispense:  10 mL    Refill:  0    Return in about 4 months (around 08/16/2022) for ADD.    This visit occurred during the SARS-CoV-2 public health emergency.  Safety protocols were in place, including screening questions prior to the visit, additional usage of staff PPE, and extensive cleaning of exam room while observing appropriate contact time as indicated for disinfecting solutions.

## 2022-04-16 NOTE — Assessment & Plan Note (Signed)
Is a well with Vyvanse in the past.  Restarting this at 20 mg daily. Return in about 4 months (around 08/16/2022) for ADD.

## 2022-05-19 ENCOUNTER — Encounter: Payer: Self-pay | Admitting: Family Medicine

## 2022-05-19 DIAGNOSIS — R41840 Attention and concentration deficit: Secondary | ICD-10-CM

## 2022-05-21 MED ORDER — VYVANSE 20 MG PO CAPS: 20.0000 mg | ORAL_CAPSULE | Freq: Every day | ORAL | 0 refills | Status: DC

## 2022-05-21 NOTE — Telephone Encounter (Signed)
Completed.

## 2022-07-09 ENCOUNTER — Ambulatory Visit
Admission: EM | Admit: 2022-07-09 | Discharge: 2022-07-09 | Disposition: A | Discharge status: Home / Self Care | Payer: BC Managed Care – PPO | Attending: Family Medicine | Admitting: Family Medicine

## 2022-07-09 ENCOUNTER — Ambulatory Visit (INDEPENDENT_AMBULATORY_CARE_PROVIDER_SITE_OTHER): Payer: BC Managed Care – PPO

## 2022-07-09 DIAGNOSIS — S022XXA Fracture of nasal bones, initial encounter for closed fracture: Secondary | ICD-10-CM | POA: Diagnosis not present

## 2022-07-09 DIAGNOSIS — J3489 Other specified disorders of nose and nasal sinuses: Secondary | ICD-10-CM

## 2022-07-09 DIAGNOSIS — S0083XA Contusion of other part of head, initial encounter: Secondary | ICD-10-CM

## 2022-07-09 DIAGNOSIS — W19XXXA Unspecified fall, initial encounter: Secondary | ICD-10-CM

## 2022-07-09 MED ORDER — IBUPROFEN 800 MG PO TABS: 800.0000 mg | ORAL_TABLET | Freq: Every day | ORAL | 0 refills | Status: AC | PRN

## 2022-07-09 NOTE — ED Provider Notes (Signed)
[name]    CSN: [id number] Arrival date & time: 07/09/22  1257      History   Chief Complaint Chief Complaint  Patient presents with   Fall    Face got slammed into really hard floor by accident have a swollen nose want to make sure it's not broken and no other possible injuries - Entered by patient    HPI Brent Leach is a 22 y.o. male.   HPI   Pleasant 22 year-old male presents with face injury that occurred on Sunday, 07/07/2022.  Patient reports hitting face on hard floor from falling from his friend's shoulders. Patient reports mild nose pain and neck soreness PMH significant for hyperactivity, inattention, and hyperbilirubinemia  Past Medical History:  Diagnosis Date   Hyperactivity    Inattention    Learning difficulty involving mathematics     Patient Active Problem List   Diagnosis Date Noted   Otitis externa 04/16/2022   Viral URI 11/11/2019   ADD (attention deficit disorder) 09/23/2019   Hyperbilirubinemia 10/07/2018   Anxiety disorder 10/06/2018   Hyperactivity    Weight loss due to medication 10/21/2017   Elevated blood pressure reading 06/02/2017   Learning difficulty involving mathematics 04/28/2017   Inattention 04/28/2017    Past Surgical History:  Procedure Laterality Date   jaw tumor (benign)         Home Medications    Prior to Admission medications   Medication Sig Start Date End Date Taking? Authorizing Provider  ibuprofen (ADVIL) 800 MG tablet Take 1 tablet (800 mg total) by mouth daily as needed. 07/09/22  Yes Trevor Iha, FNP  lisdexamfetamine (VYVANSE) 20 MG capsule Take 1 capsule (20 mg total) by mouth every morning. 04/16/22  Yes Everrett Coombe, DO  lisdexamfetamine (VYVANSE) 20 MG capsule Take 1 capsule (20 mg total) by mouth daily. Fill in 30 days 04/16/22  Yes Everrett Coombe, DO  lisdexamfetamine (VYVANSE) 20 MG capsule Take 1 capsule (20 mg total) by mouth daily. Fill in 60 days 04/16/22   Everrett Coombe, DO   VYVANSE 20 MG capsule Take 1 capsule (20 mg total) by mouth daily. 05/21/22   Everrett Coombe, DO    Family History Family History  Problem Relation Age of Onset   Skin cancer Other        grandfather   Depression Mother    Diabetes Other        grandparents   Anxiety disorder Brother     Social History Social History   Tobacco Use   Smoking status: Never   Smokeless tobacco: Never  Vaping Use   Vaping Use: Never used  Substance Use Topics   Alcohol use: Yes   Drug use: No     Allergies   Concerta [methylphenidate hcl]   Review of Systems Review of Systems  HENT:         Nose pain secondary to fall onto face on Sunday, 07/07/2022  All other systems reviewed and are negative.    Physical Exam Triage Vital Signs ED Triage Vitals  Enc Vitals Group     BP      Pulse      Resp      Temp      Temp src      SpO2      Weight      Height      Head Circumference      Peak Flow      Pain Score  Pain Loc      Pain Edu?      Excl. in GC?    No data found.  Updated Vital Signs BP (!) 151/85   Pulse 97   Temp 98.3 F (36.8 C)   Resp 15   SpO2 98%      Physical Exam Vitals reviewed.  Constitutional:      Appearance: Normal appearance. He is normal weight.  HENT:     Head: Normocephalic and atraumatic.     Nose: Nose normal.     Comments: Turbinates within normal limits no deviated septum noted    Mouth/Throat:     Mouth: Mucous membranes are moist.     Pharynx: Oropharynx is clear.  Eyes:     Conjunctiva/sclera: Conjunctivae normal.     Pupils: Pupils are equal, round, and reactive to light.  Cardiovascular:     Rate and Rhythm: Normal rate and regular rhythm.     Pulses: Normal pulses.     Heart sounds: Normal heart sounds.  Pulmonary:     Effort: Pulmonary effort is normal.     Breath sounds: Normal breath sounds. No wheezing, rhonchi or rales.  Musculoskeletal:        General: Normal range of motion.     Cervical back: Normal range of  motion and neck supple.  Skin:    General: Skin is warm and dry.  Neurological:     General: No focal deficit present.     Mental Status: He is alert and oriented to person, place, and time. Mental status is at baseline.  Psychiatric:        Mood and Affect: Mood normal.        Behavior: Behavior normal.        Thought Content: Thought content normal.      UC Treatments / Results  Labs (all labs ordered are listed, but only abnormal results are displayed) Labs Reviewed - No data to display  EKG   Radiology DG Facial Bones Complete  Result Date: 07/09/2022 CLINICAL DATA:  Fall.  Facial trauma EXAM: FACIAL BONES COMPLETE 3+V COMPARISON:  None Available. FINDINGS: Acute nondisplaced right nasal bone fracture. No additional maxillofacial bone fractures are evident radiographically. Paranasal sinuses and mastoid air cells are aerated. No orbital emphysema or sinus air-fluid levels are seen. IMPRESSION: 1. Acute nondisplaced right nasal bone fracture. 2. No additional facial bone fractures are identified. If there is persistent clinical concern, a maxillofacial bone CT should be considered. Electronically Signed   By: Duanne Guess D.O.   On: 07/09/2022 13:54    Procedures Procedures (including critical care time)  Medications Ordered in UC Medications - No data to display  Initial Impression / Assessment and Plan / UC Course  I have reviewed the triage vital signs and the nursing notes.  Pertinent labs & imaging results that were available during my care of the patient were reviewed by me and considered in my medical decision making (see chart for details).     MDM: 1.  Closed fracture of nasal bone, initial encounter-Advised patient of acute nondisplaced right nasal bone fracture with hardcopy of x-ray read.  Advised patient to avoid any contact of this area for the next 3 to 4 weeks. 2.  Nose pain-Rx'd Ibuprofen 800 mg daily, prn. Advised patient may use Ibuprofen 800 mg daily,  as needed.  Advised may ice affected area of nose for 20 to 30 minutes 3 times daily for soft tissue swelling and pain.  Advised  patient if symptoms (pain, numbness, difficulty breathing) worsen please follow-up with PCP or ENT for further evaluation.  Patient discharged home, hemodynamically stable.   Final Clinical Impressions(s) / UC Diagnoses   Final diagnoses:  Closed fracture of nasal bone, initial encounter  Nose pain     Discharge Instructions      Advised patient of acute nondisplaced right nasal bone fracture with hardcopy of x-ray read.  Advised patient to avoid any contact of this area for the next 3 to 4 weeks.  Advised patient may use Ibuprofen 800 mg daily, as needed.  Advised may ice affected area of nose for 20 to 30 minutes 3 times daily for soft tissue swelling and pain.  Advised patient if symptoms (pain, numbness, difficulty breathing) worsen please follow-up with PCP or ENT for further evaluation.     ED Prescriptions     Medication Sig Dispense Auth. Provider   ibuprofen (ADVIL) 800 MG tablet Take 1 tablet (800 mg total) by mouth daily as needed. 24 tablet Trevor Iha, FNP      PDMP not reviewed this encounter.   Trevor Iha, FNP 07/09/22 1422

## 2022-07-09 NOTE — ED Triage Notes (Signed)
Pt presents to [facility] with co of fall from friends sholdures on Sunday morning. Pt reports hitting face on hard floor. Pt reports mild nose pain and neck soreness.

## 2022-07-09 NOTE — Discharge Instructions (Addendum)
Advised patient of acute nondisplaced right nasal bone fracture with hardcopy of x-ray read.  Advised patient to avoid any contact of this area for the next 3 to 4 weeks.  Advised patient may use Ibuprofen 800 mg daily, as needed.  Advised may ice affected area of nose for 20 to 30 minutes 3 times daily for soft tissue swelling and pain.  Advised patient if symptoms (pain, numbness, difficulty breathing) worsen please follow-up with PCP or ENT for further evaluation.

## 2022-08-16 ENCOUNTER — Ambulatory Visit: Payer: BC Managed Care – PPO | Admitting: Family Medicine

## 2023-12-01 ENCOUNTER — Encounter: Payer: Self-pay | Admitting: Family Medicine

## 2023-12-01 ENCOUNTER — Ambulatory Visit: Admitting: Family Medicine

## 2023-12-01 ENCOUNTER — Ambulatory Visit (INDEPENDENT_AMBULATORY_CARE_PROVIDER_SITE_OTHER)

## 2023-12-01 VITALS — BP 143/86 | HR 73 | Ht 71.0 in | Wt 231.0 lb

## 2023-12-01 DIAGNOSIS — M79661 Pain in right lower leg: Secondary | ICD-10-CM

## 2023-12-01 DIAGNOSIS — F900 Attention-deficit hyperactivity disorder, predominantly inattentive type: Secondary | ICD-10-CM | POA: Diagnosis not present

## 2023-12-01 DIAGNOSIS — M79669 Pain in unspecified lower leg: Secondary | ICD-10-CM | POA: Diagnosis not present

## 2023-12-01 DIAGNOSIS — M79662 Pain in left lower leg: Secondary | ICD-10-CM

## 2023-12-01 MED ORDER — LISDEXAMFETAMINE DIMESYLATE 20 MG PO CAPS: 20.0000 mg | ORAL_CAPSULE | Freq: Every day | ORAL | 0 refills | Status: DC

## 2023-12-01 MED ORDER — VYVANSE 20 MG PO CAPS: 20.0000 mg | ORAL_CAPSULE | Freq: Every day | ORAL | 0 refills | Status: DC

## 2023-12-01 NOTE — Assessment & Plan Note (Signed)
 Is a well with Vyvanse  in the past.  Restarting this at 20 mg daily. Return in about 2 weeks (around 12/15/2023) for nurse visit for BP check.

## 2023-12-01 NOTE — Assessment & Plan Note (Addendum)
 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  If symptoms continue he may need MRI.

## 2023-12-01 NOTE — Progress Notes (Signed)
 Brent Leach - 23 y.o. male MRN [id number]  Date of birth: 07-23-2000  Subjective Chief Complaint  Patient presents with   Leg Pain    HPI Brent Leach is a 23 year old male here today with complaint of bilateral leg pain.  Pain started a few months ago when he started running more again.  Initially started about 2 miles and then increased his mileage to 7 miles.  It was at this point he started having pain.  He is out running for several weeks and pain improved however he did recently restarted again and began having pain again.  Pain is located along the mid to lower tibia.  Denies swelling.  He would like to restart his Vyvanse  as well.  He is back in school and also working full-time at [employer].  Vyvanse  20 mg worked well for him previously.  He did not have any side effects from this  ROS:  A comprehensive ROS was completed and negative except as noted per HPI  Allergies  Allergen Reactions   Concerta  [Methylphenidate  Hcl] Other (See Comments)    hypersomnia    Past Medical History:  Diagnosis Date   Hyperactivity    Inattention    Learning difficulty involving mathematics     Past Surgical History:  Procedure Laterality Date   jaw tumor (benign)      Social History   Socioeconomic History   Marital status: Single    Spouse name: Not on file   Number of children: Not on file   Years of education: Not on file   Highest education level: Not on file  Occupational History   Not on file  Tobacco Use   Smoking status: Never   Smokeless tobacco: Never  Vaping Use   Vaping status: Never Used  Substance and Sexual Activity   Alcohol use: Yes   Drug use: No   Sexual activity: Never  Other Topics Concern   Not on file  Social History Narrative   Not on file   Social Drivers of Health   Financial Resource Strain: Not on file  Food Insecurity: Not on file  Transportation Needs: Not on file  Physical Activity: Not on file  Stress: Not on file  Social  Connections: Not on file    Family History  Problem Relation Age of Onset   Skin cancer Other        grandfather   Depression Mother    Diabetes Other        grandparents   Anxiety disorder Brother     Health Maintenance  Topic Date Due   Influenza Vaccine  05/04/2024 (Originally 09/05/2023)   DTaP/Tdap/Td (2 - Td or Tdap) 11/30/2024 (Originally 09/26/2021)   Hepatitis B Vaccines 19-59 Average Risk (1 of 3 - 19+ 3-dose series) 11/30/2024 (Originally 09/13/2018)   HPV VACCINES (1 - Male 3-dose series) 11/30/2024 (Originally 09/13/2014)   Hepatitis C Screening  11/30/2024 (Originally 09/12/2017)   HIV Screening  11/30/2024 (Originally 09/13/2014)   COVID-19 Vaccine (2 - 2025-26 season) 12/16/2024 (Originally 10/06/2023)   Pneumococcal Vaccine  Aged Out   Meningococcal B Vaccine  Aged Out     ----------------------------------------------------------------------------------------------------------------------------------------------------------------------------------------------------------------- Physical Exam BP (!) 143/86   Pulse 73   Ht 5' 11 (1.803 m)   Wt 231 lb (104.8 kg)   SpO2 98%   BMI 32.22 kg/m   Physical Exam HENT:     Head: Normocephalic and atraumatic.  Eyes:     General: No scleral icterus. Cardiovascular:  Rate and Rhythm: Normal rate and regular rhythm.  Pulmonary:     Effort: Pulmonary effort is normal.     Breath sounds: Normal breath sounds.  Musculoskeletal:     Cervical back: Neck supple.  Neurological:     Mental Status: He is alert.  Psychiatric:        Mood and Affect: Mood normal.        Behavior: Behavior normal.     ------------------------------------------------------------------------------------------------------------------------------------------------------------------------------------------------------------------- Assessment and Plan  ADD (attention deficit disorder) Is a well with Vyvanse  in the past.  Restarting this at 20 mg  daily. Return in about 2 weeks (around 12/15/2023) for nurse visit for BP check.   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  If symptoms continue he may need MRI.   Meds ordered this encounter  Medications   lisdexamfetamine (VYVANSE ) 20 MG capsule    Sig: Take 1 capsule (20 mg total) by mouth daily. Fill in 30 days    Dispense:  30 capsule    Refill:  0   lisdexamfetamine (VYVANSE ) 20 MG capsule    Sig: Take 1 capsule (20 mg total) by mouth daily. Fill in 60 days    Dispense:  30 capsule    Refill:  0   VYVANSE  20 MG capsule    Sig: Take 1 capsule (20 mg total) by mouth daily.    Dispense:  30 capsule    Refill:  0   AKI asymptomatic Return in about 2 weeks (around 12/15/2023) for nurse visit for BP check.

## 2023-12-12 ENCOUNTER — Ambulatory Visit: Payer: Self-pay | Admitting: Family Medicine

## 2023-12-15 ENCOUNTER — Ambulatory Visit (INDEPENDENT_AMBULATORY_CARE_PROVIDER_SITE_OTHER)

## 2023-12-15 VITALS — BP 146/87 | HR 89 | Resp 18 | Ht 71.0 in | Wt 231.0 lb

## 2023-12-15 DIAGNOSIS — R030 Elevated blood-pressure reading, without diagnosis of hypertension: Secondary | ICD-10-CM

## 2023-12-15 NOTE — Progress Notes (Signed)
   Subjective:    Patient ID: Brent Leach, male    DOB: 2000-12-22, 23 y.o.   MRN: [id number]  HPI  Encounter for 2 week BP recheck. Last OV it read 143/86. Patient is currently not taking BP medication. Denies chest pain, shortness of breath, palpitations, headache or vision changes.     Review of Systems     Objective:          Assessment & Plan:   Patients first BP reading in the office today is 143/87 sat for 10 mins and his second reading was 129/72 reported to Dr. Alvia who would not like to make any changes. Patient is to RTC PRN

## 2024-02-19 ENCOUNTER — Ambulatory Visit: Admitting: Family Medicine

## 2024-02-19 ENCOUNTER — Encounter: Payer: Self-pay | Admitting: Family Medicine

## 2024-02-19 VITALS — BP 136/79 | HR 76 | Ht 71.0 in | Wt 244.0 lb

## 2024-02-19 DIAGNOSIS — M79669 Pain in unspecified lower leg: Secondary | ICD-10-CM | POA: Diagnosis not present

## 2024-02-19 MED ORDER — LISDEXAMFETAMINE DIMESYLATE 20 MG PO CAPS: 20.0000 mg | ORAL_CAPSULE | Freq: Every day | ORAL | 0 refills | Status: AC

## 2024-02-19 NOTE — Assessment & Plan Note (Signed)
 Initial xrays were negative. Concern for possible tibial stress fracture given continued and recurrent pain.  Referral placed to orthopedics/sports medicine.

## 2024-02-19 NOTE — Progress Notes (Signed)
 " Brent Leach - 24 y.o. male MRN [id number]  Date of birth: 11-04-2000  Subjective Chief Complaint  Patient presents with   Leg Injury    HPI Brent Leach is a 23 y.o. male here today for follow up of shin pain.  He developed b/l shin pain while running the past fall.  Xrays negative.  Stopped running for several weeks and was having improvement, however he recently resumed running and is now having pain again.  Pain started again after running only 1 mile and has lingered since that time.  He has tried icing the areas without much relief.   ROS:  A comprehensive ROS was completed and negative except as noted per HPI   Allergies[1]  Past Medical History:  Diagnosis Date   Hyperactivity    Inattention    Learning difficulty involving mathematics     Past Surgical History:  Procedure Laterality Date   jaw tumor (benign)      Social History   Socioeconomic History   Marital status: Single    Spouse name: Not on file   Number of children: Not on file   Years of education: Not on file   Highest education level: Not on file  Occupational History   Not on file  Tobacco Use   Smoking status: Never   Smokeless tobacco: Never  Vaping Use   Vaping status: Never Used  Substance and Sexual Activity   Alcohol use: Yes   Drug use: No   Sexual activity: Never  Other Topics Concern   Not on file  Social History Narrative   Not on file   Social Drivers of Health   Tobacco Use: Low Risk (02/19/2024)   Patient History    Smoking Tobacco Use: Never    Smokeless Tobacco Use: Never    Passive Exposure: Not on file  Financial Resource Strain: Not on file  Food Insecurity: Not on file  Transportation Needs: Not on file  Physical Activity: Not on file  Stress: Not on file  Social Connections: Not on file  Depression (PHQ2-9): Low Risk (12/01/2023)   Depression (PHQ2-9)    PHQ-2 Score: 0  Alcohol Screen: Not on file  Housing: Not on file  Utilities: Not on file  Health  Literacy: Not on file    Family History  Problem Relation Age of Onset   Skin cancer Other        grandfather   Depression Mother    Diabetes Other        grandparents   Anxiety disorder Brother     Health Maintenance  Topic Date Due   Influenza Vaccine  05/04/2024 (Originally 09/05/2023)   DTaP/Tdap/Td (2 - Td or Tdap) 11/30/2024 (Originally 09/26/2021)   Hepatitis B Vaccines 19-59 Average Risk (1 of 3 - 19+ 3-dose series) 11/30/2024 (Originally 09/13/2018)   HPV VACCINES (1 - Male 3-dose series) 11/30/2024 (Originally 09/13/2014)   Hepatitis C Screening  11/30/2024 (Originally 09/12/2017)   HIV Screening  11/30/2024 (Originally 09/13/2014)   COVID-19 Vaccine (2 - 2025-26 season) 12/16/2024 (Originally 10/06/2023)   Pneumococcal Vaccine  Aged Out   Meningococcal B Vaccine  Aged Out     ----------------------------------------------------------------------------------------------------------------------------------------------------------------------------------------------------------------- Physical Exam BP (!) 150/80 (BP Location: Left Arm, Patient Position: Sitting, Cuff Size: Large)   Pulse 76   Ht 5' 11 (1.803 m)   Wt 244 lb (110.7 kg)   SpO2 97%   BMI 34.03 kg/m   Physical Exam Constitutional:      Appearance: Normal  appearance.  Eyes:     General: No scleral icterus. Cardiovascular:     Rate and Rhythm: Normal rate and regular rhythm.  Pulmonary:     Effort: Pulmonary effort is normal.     Breath sounds: Normal breath sounds.  Musculoskeletal:     Cervical back: Neck supple.  Neurological:     Mental Status: He is alert.  Psychiatric:        Mood and Affect: Mood normal.        Behavior: Behavior normal.     ------------------------------------------------------------------------------------------------------------------------------------------------------------------------------------------------------------------- Assessment and Plan  Pain in the shins,  unspecified laterality Initial xrays were negative. Concern for possible tibial stress fracture given continued and recurrent pain.  Referral placed to orthopedics/sports medicine.     Meds ordered this encounter  Medications   lisdexamfetamine  (VYVANSE ) 20 MG capsule    Sig: Take 1 capsule (20 mg total) by mouth daily. Fill in 30 days    Dispense:  30 capsule    Refill:  0   lisdexamfetamine  (VYVANSE ) 20 MG capsule    Sig: Take 1 capsule (20 mg total) by mouth daily. Fill in 60 days    Dispense:  30 capsule    Refill:  0    No follow-ups on file.        [1]  Allergies Allergen Reactions   Concerta  [Methylphenidate  Hcl] Other (See Comments)    hypersomnia   "
